# Patient Record
Sex: Male | Born: 1935 | Race: White | Hispanic: No | Marital: Married | Smoking: Never smoker
Health system: Southern US, Community
[De-identification: ages and names within clinical notes are randomized; demographics above are authoritative.]

## PROBLEM LIST (undated history)

## (undated) ENCOUNTER — Emergency Department (HOSPITAL_COMMUNITY): Admission: EM | Payer: Medicare PPO | Source: Home / Self Care

## (undated) DIAGNOSIS — F0390 Unspecified dementia without behavioral disturbance: Secondary | ICD-10-CM

## (undated) DIAGNOSIS — I4891 Unspecified atrial fibrillation: Principal | ICD-10-CM

## (undated) DIAGNOSIS — F0280 Dementia in other diseases classified elsewhere without behavioral disturbance: Secondary | ICD-10-CM

## (undated) DIAGNOSIS — G309 Alzheimer's disease, unspecified: Secondary | ICD-10-CM

## (undated) DIAGNOSIS — I2510 Atherosclerotic heart disease of native coronary artery without angina pectoris: Secondary | ICD-10-CM

---

## 2010-08-29 ENCOUNTER — Emergency Department (HOSPITAL_BASED_OUTPATIENT_CLINIC_OR_DEPARTMENT_OTHER)
Admission: EM | Admit: 2010-08-29 | Discharge: 2010-08-29 | Payer: Self-pay | Source: Home / Self Care | Admitting: Emergency Medicine

## 2010-10-05 ENCOUNTER — Emergency Department (INDEPENDENT_AMBULATORY_CARE_PROVIDER_SITE_OTHER): Payer: Medicare PPO

## 2010-10-05 ENCOUNTER — Emergency Department (HOSPITAL_BASED_OUTPATIENT_CLINIC_OR_DEPARTMENT_OTHER)
Admission: EM | Admit: 2010-10-05 | Discharge: 2010-10-05 | Disposition: A | Discharge status: Home / Self Care | Payer: Medicare PPO | Attending: Emergency Medicine | Admitting: Emergency Medicine

## 2010-10-05 DIAGNOSIS — E041 Nontoxic single thyroid nodule: Secondary | ICD-10-CM | POA: Insufficient documentation

## 2010-10-05 DIAGNOSIS — R0602 Shortness of breath: Secondary | ICD-10-CM | POA: Insufficient documentation

## 2010-10-05 DIAGNOSIS — I10 Essential (primary) hypertension: Secondary | ICD-10-CM | POA: Insufficient documentation

## 2010-10-05 DIAGNOSIS — J438 Other emphysema: Secondary | ICD-10-CM

## 2010-10-05 DIAGNOSIS — Z79899 Other long term (current) drug therapy: Secondary | ICD-10-CM | POA: Insufficient documentation

## 2010-10-05 DIAGNOSIS — I2510 Atherosclerotic heart disease of native coronary artery without angina pectoris: Secondary | ICD-10-CM | POA: Insufficient documentation

## 2010-10-05 DIAGNOSIS — E7800 Pure hypercholesterolemia, unspecified: Secondary | ICD-10-CM | POA: Insufficient documentation

## 2010-10-05 DIAGNOSIS — R0989 Other specified symptoms and signs involving the circulatory and respiratory systems: Secondary | ICD-10-CM | POA: Insufficient documentation

## 2010-10-05 DIAGNOSIS — I498 Other specified cardiac arrhythmias: Secondary | ICD-10-CM | POA: Insufficient documentation

## 2010-10-05 DIAGNOSIS — D721 Eosinophilia, unspecified: Secondary | ICD-10-CM | POA: Insufficient documentation

## 2010-10-05 DIAGNOSIS — R0609 Other forms of dyspnea: Secondary | ICD-10-CM | POA: Insufficient documentation

## 2010-10-05 DIAGNOSIS — I4891 Unspecified atrial fibrillation: Secondary | ICD-10-CM | POA: Insufficient documentation

## 2010-10-05 DIAGNOSIS — J189 Pneumonia, unspecified organism: Secondary | ICD-10-CM | POA: Insufficient documentation

## 2010-10-05 LAB — POCT CARDIAC MARKERS
CKMB, poc: <1 ng/mL — ABNORMAL LOW (ref 1.0–8.0)
Myoglobin, poc: 116 ng/mL (ref 12–200)
Troponin i, poc: <0.05 ng/mL (ref 0.00–0.09)

## 2010-10-05 LAB — CBC
HCT: 35.2 % — ABNORMAL LOW (ref 39.0–52.0)
MCV: 86.5 fL (ref 78.0–100.0)
RBC: 4.07 MIL/uL — ABNORMAL LOW (ref 4.22–5.81)
RDW: 13.6 % (ref 11.5–15.5)
WBC: 6.8 10*3/uL (ref 4.0–10.5)

## 2010-10-05 LAB — DIFFERENTIAL
Basophils Relative: 1 % (ref 0–1)
Eosinophils Relative: 8 % — ABNORMAL HIGH (ref 0–5)
Lymphocytes Relative: 32 % (ref 12–46)
Monocytes Absolute: 0.7 10*3/uL (ref 0.1–1.0)
Monocytes Relative: 10 % (ref 3–12)
Neutrophils Relative %: 49 % (ref 43–77)

## 2010-10-05 LAB — BASIC METABOLIC PANEL
BUN: 20 mg/dL (ref 6–23)
Calcium: 9.7 mg/dL (ref 8.4–10.5)
GFR calc non Af Amer: 48 mL/min — ABNORMAL LOW (ref >60)
Glucose, Bld: 94 mg/dL (ref 70–99)
Sodium: 142 mEq/L (ref 135–145)

## 2010-10-05 LAB — D-DIMER, QUANTITATIVE: D-Dimer, Quant: 2.4 ug/mL-FEU — ABNORMAL HIGH (ref 0.00–0.48)

## 2010-10-05 MED ORDER — IOHEXOL 350 MG/ML SOLN
80.0000 mL | Freq: Once | INTRAVENOUS | Status: AC | PRN
  Administered 2010-10-05: 80 mL via INTRAVENOUS

## 2011-02-05 ENCOUNTER — Emergency Department (HOSPITAL_COMMUNITY): Admission: EM | Admit: 2011-02-05 | Payer: Medicare PPO | Source: Home / Self Care

## 2011-02-05 ENCOUNTER — Emergency Department (HOSPITAL_BASED_OUTPATIENT_CLINIC_OR_DEPARTMENT_OTHER)
Admission: EM | Admit: 2011-02-05 | Discharge: 2011-02-05 | Disposition: A | Discharge status: Home / Self Care | Payer: No Typology Code available for payment source | Attending: Emergency Medicine | Admitting: Emergency Medicine

## 2011-02-05 ENCOUNTER — Emergency Department (INDEPENDENT_AMBULATORY_CARE_PROVIDER_SITE_OTHER): Payer: No Typology Code available for payment source

## 2011-02-05 DIAGNOSIS — E7800 Pure hypercholesterolemia, unspecified: Secondary | ICD-10-CM | POA: Insufficient documentation

## 2011-02-05 DIAGNOSIS — R030 Elevated blood-pressure reading, without diagnosis of hypertension: Secondary | ICD-10-CM

## 2011-02-05 DIAGNOSIS — I10 Essential (primary) hypertension: Secondary | ICD-10-CM | POA: Insufficient documentation

## 2011-02-05 DIAGNOSIS — I2510 Atherosclerotic heart disease of native coronary artery without angina pectoris: Secondary | ICD-10-CM | POA: Insufficient documentation

## 2011-02-05 DIAGNOSIS — I4891 Unspecified atrial fibrillation: Secondary | ICD-10-CM | POA: Insufficient documentation

## 2011-02-05 DIAGNOSIS — R42 Dizziness and giddiness: Secondary | ICD-10-CM

## 2011-02-05 DIAGNOSIS — R059 Cough, unspecified: Secondary | ICD-10-CM

## 2011-02-05 DIAGNOSIS — R220 Localized swelling, mass and lump, head: Secondary | ICD-10-CM | POA: Insufficient documentation

## 2011-02-05 DIAGNOSIS — R221 Localized swelling, mass and lump, neck: Secondary | ICD-10-CM | POA: Insufficient documentation

## 2011-02-05 DIAGNOSIS — Z79899 Other long term (current) drug therapy: Secondary | ICD-10-CM | POA: Insufficient documentation

## 2011-02-05 DIAGNOSIS — M539 Dorsopathy, unspecified: Secondary | ICD-10-CM

## 2011-02-05 DIAGNOSIS — R05 Cough: Secondary | ICD-10-CM

## 2011-02-05 LAB — URINALYSIS, ROUTINE W REFLEX MICROSCOPIC
Glucose, UA: NEGATIVE mg/dL
Hgb urine dipstick: NEGATIVE
Specific Gravity, Urine: 1.011 (ref 1.005–1.030)
Urobilinogen, UA: 0.2 mg/dL (ref 0.0–1.0)
pH: 6.5 (ref 5.0–8.0)

## 2011-02-05 LAB — CBC
MCH: 30.5 pg (ref 26.0–34.0)
MCHC: 35.2 g/dL (ref 30.0–36.0)
MCV: 86.5 fL (ref 78.0–100.0)
Platelets: 245 10*3/uL (ref 150–400)
RDW: 14 % (ref 11.5–15.5)

## 2011-02-05 LAB — DIFFERENTIAL
Basophils Absolute: 0 10*3/uL (ref 0.0–0.1)
Basophils Relative: 0 % (ref 0–1)
Eosinophils Absolute: 0.2 10*3/uL (ref 0.0–0.7)
Monocytes Absolute: 1.3 10*3/uL — ABNORMAL HIGH (ref 0.1–1.0)
Monocytes Relative: 12 % (ref 3–12)
Neutrophils Relative %: 67 % (ref 43–77)

## 2011-02-05 LAB — COMPREHENSIVE METABOLIC PANEL
AST: 22 U/L (ref 0–37)
Albumin: 4.3 g/dL (ref 3.5–5.2)
Calcium: 10.6 mg/dL — ABNORMAL HIGH (ref 8.4–10.5)
Creatinine, Ser: 1.2 mg/dL (ref 0.50–1.35)
GFR calc non Af Amer: 57 mL/min — ABNORMAL LOW (ref >60)
Total Protein: 7.7 g/dL (ref 6.0–8.3)

## 2012-07-14 ENCOUNTER — Encounter (HOSPITAL_COMMUNITY): Payer: Self-pay | Admitting: Emergency Medicine

## 2012-07-14 ENCOUNTER — Inpatient Hospital Stay (HOSPITAL_COMMUNITY)
Admission: EM | Admit: 2012-07-14 | Discharge: 2012-07-18 | DRG: 309 | Disposition: A | Discharge status: Nursing Home (Includes ICF) | Payer: No Typology Code available for payment source | Attending: Internal Medicine | Admitting: Internal Medicine

## 2012-07-14 DIAGNOSIS — E876 Hypokalemia: Secondary | ICD-10-CM | POA: Diagnosis present

## 2012-07-14 DIAGNOSIS — I4891 Unspecified atrial fibrillation: Principal | ICD-10-CM | POA: Diagnosis present

## 2012-07-14 DIAGNOSIS — I2510 Atherosclerotic heart disease of native coronary artery without angina pectoris: Secondary | ICD-10-CM | POA: Diagnosis present

## 2012-07-14 DIAGNOSIS — M479 Spondylosis, unspecified: Secondary | ICD-10-CM | POA: Diagnosis present

## 2012-07-14 DIAGNOSIS — Z7982 Long term (current) use of aspirin: Secondary | ICD-10-CM

## 2012-07-14 DIAGNOSIS — R5381 Other malaise: Secondary | ICD-10-CM | POA: Diagnosis present

## 2012-07-14 DIAGNOSIS — Z7901 Long term (current) use of anticoagulants: Secondary | ICD-10-CM

## 2012-07-14 DIAGNOSIS — M549 Dorsalgia, unspecified: Secondary | ICD-10-CM | POA: Diagnosis present

## 2012-07-14 DIAGNOSIS — Z79899 Other long term (current) drug therapy: Secondary | ICD-10-CM

## 2012-07-14 DIAGNOSIS — G309 Alzheimer's disease, unspecified: Secondary | ICD-10-CM | POA: Diagnosis present

## 2012-07-14 DIAGNOSIS — E2749 Other adrenocortical insufficiency: Secondary | ICD-10-CM | POA: Diagnosis present

## 2012-07-14 DIAGNOSIS — F0280 Dementia in other diseases classified elsewhere without behavioral disturbance: Secondary | ICD-10-CM | POA: Diagnosis present

## 2012-07-14 DIAGNOSIS — I951 Orthostatic hypotension: Secondary | ICD-10-CM | POA: Diagnosis present

## 2012-07-14 DIAGNOSIS — R410 Disorientation, unspecified: Secondary | ICD-10-CM

## 2012-07-14 DIAGNOSIS — F0390 Unspecified dementia without behavioral disturbance: Secondary | ICD-10-CM | POA: Diagnosis present

## 2012-07-14 HISTORY — DX: Atherosclerotic heart disease of native coronary artery without angina pectoris: I25.10

## 2012-07-14 HISTORY — DX: Alzheimer's disease, unspecified: G30.9

## 2012-07-14 HISTORY — DX: Dementia in other diseases classified elsewhere, unspecified severity, without behavioral disturbance, psychotic disturbance, mood disturbance, and anxiety: F02.80

## 2012-07-14 HISTORY — DX: Unspecified dementia, unspecified severity, without behavioral disturbance, psychotic disturbance, mood disturbance, and anxiety: F03.90

## 2012-07-14 HISTORY — DX: Unspecified atrial fibrillation: I48.91

## 2012-07-14 NOTE — ED Notes (Signed)
Per EMS, patient complaining of back pain.  Patient had fall back in November of this year.  Patient was transported tot he hospital, seen, and then released to [facility].  Patient has been shakier on feet since the fall, but can still ambulate.  Today, patient has not been able to get out of bed due to pain.  Alert and oriented x4 (per baseline; patient has dementia).

## 2012-07-15 ENCOUNTER — Emergency Department (HOSPITAL_COMMUNITY): Payer: No Typology Code available for payment source

## 2012-07-15 DIAGNOSIS — F0390 Unspecified dementia without behavioral disturbance: Secondary | ICD-10-CM

## 2012-07-15 DIAGNOSIS — E876 Hypokalemia: Secondary | ICD-10-CM | POA: Diagnosis present

## 2012-07-15 DIAGNOSIS — I4891 Unspecified atrial fibrillation: Principal | ICD-10-CM

## 2012-07-15 DIAGNOSIS — M549 Dorsalgia, unspecified: Secondary | ICD-10-CM

## 2012-07-15 LAB — CBC
Hemoglobin: 12.7 g/dL — ABNORMAL LOW (ref 13.0–17.0)
MCH: 29.5 pg (ref 26.0–34.0)
MCHC: 33.8 g/dL (ref 30.0–36.0)
MCV: 87.2 fL (ref 78.0–100.0)
Platelets: 265 10*3/uL (ref 150–400)
RBC: 4.31 MIL/uL (ref 4.22–5.81)

## 2012-07-15 LAB — GLUCOSE, CAPILLARY

## 2012-07-15 LAB — URINALYSIS, ROUTINE W REFLEX MICROSCOPIC
Bilirubin Urine: NEGATIVE
Nitrite: NEGATIVE
Protein, ur: 100 mg/dL — AB
Specific Gravity, Urine: 1.017 (ref 1.005–1.030)
Urobilinogen, UA: 0.2 mg/dL (ref 0.0–1.0)

## 2012-07-15 LAB — BASIC METABOLIC PANEL
BUN: 32 mg/dL — ABNORMAL HIGH (ref 6–23)
BUN: 25 mg/dL — ABNORMAL HIGH (ref 6–23)
CO2: 25 mEq/L (ref 19–32)
Calcium: 9.3 mg/dL (ref 8.4–10.5)
Calcium: 8.7 mg/dL (ref 8.4–10.5)
GFR calc Af Amer: 56 mL/min — ABNORMAL LOW (ref >90)
GFR calc non Af Amer: 49 mL/min — ABNORMAL LOW (ref >90)
GFR calc non Af Amer: 51 mL/min — ABNORMAL LOW (ref >90)
Glucose, Bld: 117 mg/dL — ABNORMAL HIGH (ref 70–99)
Glucose, Bld: 99 mg/dL (ref 70–99)
Sodium: 137 mEq/L (ref 135–145)

## 2012-07-15 LAB — MRSA PCR SCREENING: MRSA by PCR: NEGATIVE

## 2012-07-15 LAB — CBC WITH DIFFERENTIAL/PLATELET
Basophils Absolute: 0 10*3/uL (ref 0.0–0.1)
Basophils Relative: 0 % (ref 0–1)
Eosinophils Absolute: 0.2 10*3/uL (ref 0.0–0.7)
Hemoglobin: 13.7 g/dL (ref 13.0–17.0)
MCH: 29.4 pg (ref 26.0–34.0)
MCHC: 33.4 g/dL (ref 30.0–36.0)
Monocytes Relative: 12 % (ref 3–12)
Neutro Abs: 6.8 10*3/uL (ref 1.7–7.7)
Neutrophils Relative %: 73 % (ref 43–77)
RDW: 14.1 % (ref 11.5–15.5)

## 2012-07-15 MED ORDER — ONDANSETRON HCL 4 MG/2ML IJ SOLN: 4.0000 mg | Freq: Four times a day (QID) | INTRAMUSCULAR | Status: DC | PRN

## 2012-07-15 MED ORDER — DILTIAZEM HCL 25 MG/5ML IV SOLN
10.0000 mg | Freq: Once | INTRAVENOUS | Status: AC
  Administered 2012-07-15: 25 mg via INTRAVENOUS
  Filled 2012-07-15: qty 5

## 2012-07-15 MED ORDER — ONDANSETRON HCL 4 MG PO TABS: 4.0000 mg | ORAL_TABLET | Freq: Four times a day (QID) | ORAL | Status: DC | PRN

## 2012-07-15 MED ORDER — OXYBUTYNIN CHLORIDE 5 MG PO TABS
5.0000 mg | ORAL_TABLET | Freq: Two times a day (BID) | ORAL | Status: DC
  Administered 2012-07-15 – 2012-07-18 (×7): 5 mg via ORAL
  Filled 2012-07-15 (×12): qty 1

## 2012-07-15 MED ORDER — POTASSIUM CHLORIDE CRYS ER 20 MEQ PO TBCR
40.0000 meq | EXTENDED_RELEASE_TABLET | ORAL | Status: DC
  Administered 2012-07-15 – 2012-07-18 (×8): 40 meq via ORAL
  Filled 2012-07-15 (×12): qty 2

## 2012-07-15 MED ORDER — SODIUM CHLORIDE 0.9 % IV BOLUS (SEPSIS)
250.0000 mL | Freq: Once | INTRAVENOUS | Status: AC
  Administered 2012-07-15: 250 mL via INTRAVENOUS

## 2012-07-15 MED ORDER — SODIUM CHLORIDE 0.9 % IV SOLN: INTRAVENOUS | Status: DC

## 2012-07-15 MED ORDER — ASPIRIN EC 325 MG PO TBEC
325.0000 mg | DELAYED_RELEASE_TABLET | Freq: Every day | ORAL | Status: DC
  Administered 2012-07-15 – 2012-07-18 (×4): 325 mg via ORAL
  Filled 2012-07-15 (×5): qty 1

## 2012-07-15 MED ORDER — ACETAMINOPHEN 500 MG PO TABS
500.0000 mg | ORAL_TABLET | Freq: Every day | ORAL | Status: DC
  Administered 2012-07-15 – 2012-07-18 (×4): 500 mg via ORAL
  Filled 2012-07-15 (×4): qty 1
  Filled 2012-07-15: qty 2
  Filled 2012-07-15: qty 1

## 2012-07-15 MED ORDER — SODIUM CHLORIDE 0.9 % IV SOLN
INTRAVENOUS | Status: AC
  Administered 2012-07-15: 04:00:00 via INTRAVENOUS

## 2012-07-15 MED ORDER — DILTIAZEM HCL 25 MG/5ML IV SOLN
10.0000 mg | Freq: Once | INTRAVENOUS | Status: AC
  Administered 2012-07-15: 10 mg via INTRAVENOUS
  Filled 2012-07-15: qty 5

## 2012-07-15 MED ORDER — HYDROCORTISONE 20 MG PO TABS
20.0000 mg | ORAL_TABLET | Freq: Every day | ORAL | Status: DC
  Administered 2012-07-15 – 2012-07-18 (×4): 20 mg via ORAL
  Filled 2012-07-15 (×5): qty 1

## 2012-07-15 MED ORDER — SOTALOL HCL 80 MG PO TABS
40.0000 mg | ORAL_TABLET | Freq: Every day | ORAL | Status: DC
  Administered 2012-07-15: 40 mg via ORAL
  Administered 2012-07-16: 80 mg via ORAL
  Administered 2012-07-17 – 2012-07-18 (×2): 40 mg via ORAL
  Filled 2012-07-15 (×5): qty 0.5

## 2012-07-15 MED ORDER — MORPHINE SULFATE 2 MG/ML IJ SOLN
1.0000 mg | INTRAMUSCULAR | Status: DC | PRN
  Filled 2012-07-15: qty 1

## 2012-07-15 MED ORDER — VERAPAMIL HCL 120 MG PO TABS
120.0000 mg | ORAL_TABLET | Freq: Every day | ORAL | Status: DC
  Administered 2012-07-15: 120 mg via ORAL
  Filled 2012-07-15 (×2): qty 1

## 2012-07-15 MED ORDER — SODIUM CHLORIDE 0.9 % IV SOLN
INTRAVENOUS | Status: DC
  Administered 2012-07-15: 100 mL/h via INTRAVENOUS

## 2012-07-15 MED ORDER — HYDROCODONE-ACETAMINOPHEN 5-325 MG PO TABS
1.0000 | ORAL_TABLET | ORAL | Status: DC | PRN
  Administered 2012-07-16: 1 via ORAL
  Administered 2012-07-18: 2 via ORAL
  Filled 2012-07-15: qty 1
  Filled 2012-07-15: qty 2

## 2012-07-15 MED ORDER — CALCIUM POLYCARBOPHIL 625 MG PO TABS
1250.0000 mg | ORAL_TABLET | Freq: Two times a day (BID) | ORAL | Status: DC
  Administered 2012-07-15: 1250 mg via ORAL
  Administered 2012-07-16: 11:00:00 via ORAL
  Administered 2012-07-17 (×2): 1250 mg via ORAL
  Administered 2012-07-18: 11:00:00 via ORAL
  Filled 2012-07-15 (×10): qty 2

## 2012-07-15 MED ORDER — DILTIAZEM HCL 100 MG IV SOLR
5.0000 mg/h | Freq: Once | INTRAVENOUS | Status: AC
  Administered 2012-07-15: 5 mg/h via INTRAVENOUS
  Filled 2012-07-15: qty 100

## 2012-07-15 MED ORDER — FLUDROCORTISONE ACETATE 0.1 MG PO TABS
0.0500 mg | ORAL_TABLET | Freq: Every day | ORAL | Status: DC
  Administered 2012-07-15 – 2012-07-17 (×3): 0.05 mg via ORAL
  Administered 2012-07-18: 11:00:00 via ORAL
  Filled 2012-07-15 (×5): qty 0.5

## 2012-07-15 MED ORDER — ENOXAPARIN SODIUM 30 MG/0.3ML ~~LOC~~ SOLN
30.0000 mg | SUBCUTANEOUS | Status: DC
  Administered 2012-07-15 – 2012-07-18 (×4): 30 mg via SUBCUTANEOUS
  Filled 2012-07-15 (×5): qty 0.3

## 2012-07-15 MED ORDER — SIMETHICONE 80 MG PO CHEW
80.0000 mg | CHEWABLE_TABLET | Freq: Four times a day (QID) | ORAL | Status: DC | PRN
  Filled 2012-07-15: qty 1

## 2012-07-15 MED ORDER — GALANTAMINE HYDROBROMIDE ER 24 MG PO CP24
24.0000 mg | ORAL_CAPSULE | Freq: Every day | ORAL | Status: DC
  Administered 2012-07-17 – 2012-07-18 (×2): 24 mg via ORAL
  Filled 2012-07-15 (×6): qty 1

## 2012-07-15 MED ORDER — DILTIAZEM HCL 100 MG IV SOLR
5.0000 mg/h | INTRAVENOUS | Status: DC
  Administered 2012-07-15: 5 mg/h via INTRAVENOUS
  Filled 2012-07-15: qty 100

## 2012-07-15 MED ORDER — MELOXICAM 15 MG PO TABS
15.0000 mg | ORAL_TABLET | Freq: Every day | ORAL | Status: DC
  Administered 2012-07-15: 15 mg via ORAL
  Administered 2012-07-16: 7.5 mg via ORAL
  Administered 2012-07-17 – 2012-07-18 (×2): 15 mg via ORAL
  Filled 2012-07-15 (×5): qty 1

## 2012-07-15 MED ORDER — NITROGLYCERIN 0.4 MG SL SUBL
0.4000 mg | SUBLINGUAL_TABLET | SUBLINGUAL | Status: DC | PRN
Start: 2012-07-15 — End: 2012-07-18

## 2012-07-15 MED ORDER — MEMANTINE HCL 10 MG PO TABS
10.0000 mg | ORAL_TABLET | Freq: Two times a day (BID) | ORAL | Status: DC
  Administered 2012-07-15 – 2012-07-18 (×7): 10 mg via ORAL
  Filled 2012-07-15 (×12): qty 1

## 2012-07-15 NOTE — Evaluation (Signed)
Physical Therapy Evaluation Patient Details Name: Anthony Brewer MRN: [id number] DOB: 1923/05/13 Today's Date: 07/15/2012 Time: 3244-0102 PT Time Calculation (min): 21 min  PT Assessment / Plan / Recommendation Clinical Impression  Patient is an 78 yo male admitted with back pain and Afib.  Patient with decline in functional mobility and cognition.  Recommend patient return to SNF at discharge with continued therapy.  Patient will benefit from acute PT to maximize independence prior to discharge.    PT Assessment  Patient needs continued PT services    Follow Up Recommendations  SNF    Does the patient have the potential to tolerate intense rehabilitation      Barriers to Discharge Decreased caregiver support      Equipment Recommendations  None recommended by PT    Recommendations for Other Services     Frequency Min 3X/week    Precautions / Restrictions Precautions Precautions: Fall Restrictions Weight Bearing Restrictions: No   Pertinent Vitals/Pain       Mobility  Bed Mobility Bed Mobility: Supine to Sit;Sit to Supine Supine to Sit: 4: Min assist;With rails;HOB flat Sit to Supine: 4: Min assist;HOB flat Details for Bed Mobility Assistance: Verbal cues for technique and safety. Transfers Transfers: Sit to Stand;Stand to Sit Sit to Stand: 3: Mod assist;With upper extremity assist;From bed Stand to Sit: 4: Min assist;With upper extremity assist;To bed Details for Transfer Assistance: Verbal cues for hand placement and safety.  Unable to reach fully upright position. Ambulation/Gait Ambulation/Gait Assistance: 1: +2 Total assist (+1 for lines) Ambulation/Gait: Patient Percentage: 70% Ambulation Distance (Feet): 96 Feet Assistive device: Rolling walker Ambulation/Gait Assistance Details: Verbal cues to stand upright, look forward, and keep feet inside RW.  Patient with very flexed trunk during gait.  Unable to stand upright with physical assist/cuing. Gait Pattern:  Step-through pattern;Decreased stride length;Trunk flexed Gait velocity: Slow gait speed           PT Diagnosis: Difficulty walking;Abnormality of gait;Generalized weakness;Altered mental status  PT Problem List: Decreased strength;Decreased activity tolerance;Decreased balance;Decreased mobility;Decreased cognition;Decreased safety awareness;Decreased knowledge of use of DME PT Treatment Interventions: DME instruction;Gait training;Functional mobility training;Cognitive remediation;Patient/family education   PT Goals Acute Rehab PT Goals PT Goal Formulation: With patient Time For Goal Achievement: 07/22/12 Potential to Achieve Goals: Fair Pt will go Supine/Side to Sit: with supervision;with HOB 0 degrees PT Goal: Supine/Side to Sit - Progress: Goal set today Pt will go Sit to Stand: with supervision;with upper extremity assist PT Goal: Sit to Stand - Progress: Goal set today Pt will Ambulate: >150 feet;with supervision;with least restrictive assistive device PT Goal: Ambulate - Progress: Goal set today  Visit Information  Last PT Received On: 07/15/12 Assistance Needed: +2    Subjective Data  Subjective: "I need something to hold onto to walk" Patient Stated Goal: None stated   Prior Functioning  Home Living Available Help at Discharge: Skilled Nursing Facility Additional Comments: Patient in locked unit at [facility]. Prior Function Level of Independence: Needs assistance Needs Assistance: Bathing;Dressing;Meal Prep;Light Housekeeping;Gait Bath: Minimal Dressing: Supervision/set-up Meal Prep: Maximal Light Housekeeping: Total Gait Assistance: Patient was independent with gait without an assistive device. (Wanderer) Able to Take Stairs?: No Driving: No Vocation: Retired Musician: Clinical cytogeneticist  Overall Cognitive Status: History of cognitive impairments - further impaired Area of Impairment: Memory;Awareness of deficits;Problem  solving Arousal/Alertness: Awake/alert Orientation Level: Disoriented to;Place;Time;Situation Behavior During Session: [facility] for tasks performed Memory Deficits: Unable to recall recent events. Safety/Judgement: Impulsive;Decreased awareness of need  for assistance Safety/Judgement - Other Comments: Needed repeated cuing to wait until lines were arranged before getting OOB. Awareness of Deficits: Unable to state why he is in hospital    Extremity/Trunk Assessment Right Upper Extremity Assessment RUE ROM/Strength/Tone: [facility] for tasks assessed Left Upper Extremity Assessment LUE ROM/Strength/Tone: [facility] for tasks assessed Right Lower Extremity Assessment RLE ROM/Strength/Tone: Deficits RLE ROM/Strength/Tone Deficits: Strength 4-/5 Left Lower Extremity Assessment LLE ROM/Strength/Tone: Deficits LLE ROM/Strength/Tone Deficits: Strength 4-/5 Trunk Assessment Trunk Assessment: Kyphotic   Balance    End of Session PT - End of Session Equipment Utilized During Treatment: Gait belt;Oxygen Activity Tolerance: Patient limited by fatigue Patient left: in bed;with call bell/phone within reach Nurse Communication: Mobility status  GP Functional Assessment Tool Used: Clinical judgement Functional Limitation: Mobility: Walking and moving around Mobility: Walking and Moving Around Current Status ([id number]): At least 60 percent but less than 80 percent impaired, limited or restricted Mobility: Walking and Moving Around Goal Status [phone]): At least 1 percent but less than 20 percent impaired, limited or restricted   Vena Austria 07/15/2012, 5:26 PM  Durenda Hurt. Renaldo Fiddler, [facility] Acute Rehab Services Pager [phone]

## 2012-07-15 NOTE — H&P (Signed)
[facility] History and Physical  Anthony Brewer JWJ:[id number] DOB: [birth year] DOA: 07/14/2012  Referring physician: ED physician PCP: No primary provider on file.   Chief Complaint: Back pain  HPI:  Pt is 78 yo male with Alzheimer's dementia, who resides at [facility] and who was brought to [facility] ED for further evaluation of back pain that apparently started one month prior to admission and was at that time at the hospital but released with no specific findings on the cause. Per SNF staff pt was unable to get out of the bed one day prior to admission and was brought for that reason as he is usually at least able to get out of the bed. Pt able to provide some history but not details. He denies chest pain or shortness of breath, no fevers, no chills.  ED EVENTS: found to be in atrial fib with HR in 130's, requiring Cardizem drip and request for SDU admission. XRAY of the back area with DJD.  Assessment and Plan:  Principal Problem:  *Back pain - secondary to DJD and progressive deconditioning - imaging studies indicate no acute pathology - supportive care with analgesia as needed and PT evaluation Active Problems:  Atrial fibrillation - continue Cardizem drip and monitor in SDU - will likely be able to transition to PO in AM - due to high risk of fall pt not Coumadin candidate - continue Sotalol and Aspirin  Hypokalemia - will supplement - BMP in AM  Dementia - stable - continue Namenda   Code Status: Full Family Communication: Pt at bedside Disposition Plan: PT evaluation    Review of Systems:  Constitutional: Negative for fever, chills and malaise/fatigue. Negative for diaphoresis.  HENT: Negative for hearing loss, ear pain, nosebleeds, congestion, sore throat, neck pain, tinnitus and ear discharge.   Eyes: Negative for blurred vision, double vision, photophobia, pain, discharge and redness.  Respiratory: Negative for cough, hemoptysis, sputum production,  shortness of breath, wheezing and stridor.   Cardiovascular: Negative for chest pain, palpitations, orthopnea, claudication and leg swelling.  Gastrointestinal: Negative for nausea, vomiting and abdominal pain. Negative for heartburn, constipation, blood in stool and melena.  Genitourinary: Negative for dysuria, urgency, frequency, hematuria and flank pain.  Musculoskeletal: Negative for myalgias, positive for back pain Skin: Negative for itching and rash.  Neurological: Negative for dizziness, tingling, tremors, sensory change, speech change, focal weakness, loss of consciousness and headaches.  Endo/Heme/Allergies: Negative for environmental allergies and polydipsia. Does not bruise/bleed easily.  Psychiatric/Behavioral: Negative for suicidal ideas. The patient is not nervous/anxious.      Past Medical History  Diagnosis Date  . Dementia   . Alzheimer disease   . Atrial fibrillation   . Coronary artery disease     History reviewed. No pertinent past surgical history.  Social History:  reports that he has never smoked. He does not have any smokeless tobacco history on file. He reports that he does not drink alcohol or use illicit drugs.  No Known Allergies  No family history of cancers  Prior to Admission medications   Medication Sig Start Date End Date Taking? Authorizing Provider  acetaminophen (TYLENOL) 500 MG tablet Take 500 mg by mouth daily.   Yes Historical Provider, MD  aspirin EC 325 MG tablet Take 325 mg by mouth daily.   Yes Historical Provider, MD  fludrocortisone (FLORINEF) 0.1 MG tablet Take 0.05 mg by mouth daily.   Yes Historical Provider, MD  galantamine (RAZADYNE ER) 24 MG 24 hr capsule Take 24  mg by mouth daily.   Yes Historical Provider, MD  hydrocortisone (CORTEF) 20 MG tablet Take 20 mg by mouth daily.   Yes Historical Provider, MD  meloxicam (MOBIC) 15 MG tablet Take 15 mg by mouth daily.   Yes Historical Provider, MD  memantine (NAMENDA) 10 MG tablet Take  10 mg by mouth 2 (two) times daily.   Yes Historical Provider, MD  Multiple Vitamin (MULTIVITAMIN WITH MINERALS) TABS Take 1 tablet by mouth daily.   Yes Historical Provider, MD  nitroGLYCERIN (NITROSTAT) 0.4 MG SL tablet Place 0.4 mg under the tongue every 5 (five) minutes as needed. For chest pain - do not exceed 3 tabs - if no relief call md/911   Yes Historical Provider, MD  oxybutynin (DITROPAN) 5 MG tablet Take 5 mg by mouth 2 (two) times daily.   Yes Historical Provider, MD  simethicone (MYLICON) 125 MG chewable tablet Chew 125 mg by mouth 2 (two) times daily.   Yes Historical Provider, MD  sotalol (BETAPACE) 80 MG tablet Take 40 mg by mouth daily.   Yes Historical Provider, MD  verapamil (CALAN) 120 MG tablet Take 120 mg by mouth daily.   Yes Historical Provider, MD    Physical Exam: Filed Vitals:   07/14/12 2243 07/15/12 0215 07/15/12 0230  BP: 158/99 158/70   Pulse: 127    Temp: 98.7 F (37.1 C)    TempSrc: Oral    Resp: 18 18 20   SpO2: 98%      Physical Exam  Constitutional: Appears well-developed and well-nourished. No distress.  HENT: Normocephalic. External right and left ear normal. Oropharynx is clear and moist.  Eyes: Conjunctivae and EOM are normal. PERRLA, no scleral icterus.  Neck: Normal ROM. Neck supple. No JVD. No tracheal deviation. No thyromegaly.  CVS: IRRR,  no murmurs, no gallops, no carotid bruit.  Pulmonary: Effort and breath sounds normal, decreased breath sounds at bases  Abdominal: Soft. BS +,  no distension, tenderness, rebound or guarding.  Musculoskeletal: Normal range of motion. No edema and no tenderness.  Lymphadenopathy: No lymphadenopathy noted, cervical, inguinal. Neuro: Alert. Normal reflexes, muscle tone coordination. No cranial nerve deficit. Skin: Skin is warm and dry. No rash noted. Not diaphoretic. No erythema. No pallor.  Psychiatric: Normal mood and affect. Behavior, judgment, thought content normal.   Labs on Admission:  Basic  Metabolic Panel:  Lab 07/15/12 6213  NA 137  K 3.1*  CL 99  CO2 26  GLUCOSE 117*  BUN 32*  CREATININE 1.28  CALCIUM 9.3  MG --  PHOS --   Liver Function Tests: No results found for this basename: AST:5,ALT:5,ALKPHOS:5,BILITOT:5,PROT:5,ALBUMIN:5 in the last 168 hours No results found for this basename: LIPASE:5,AMYLASE:5 in the last 168 hours No results found for this basename: AMMONIA:5 in the last 168 hours CBC:  Lab 07/15/12 0036  WBC 9.4  NEUTROABS 6.8  HGB 13.7  HCT 41.0  MCV 88.0  PLT 285   Cardiac Enzymes:  Lab 07/15/12 0036  CKTOTAL --  CKMB --  CKMBINDEX --  TROPONINI <0.30   BNP: No components found with this basename: POCBNP:5 CBG: No results found for this basename: GLUCAP:5 in the last 168 hours  Radiological Exams on Admission: Dg Lumbar Spine Complete  07/15/2012  *RADIOLOGY REPORT*  Clinical Data: Severe back pain.  No known injury.  LUMBAR SPINE - COMPLETE 4+ VIEW  Comparison: None.  Findings: Five non-rib bearing lumbar vertebrae.  Sacroiliac ligament ossification on the right.  Marked degenerative changes throughout the  lumbar spine with disc space narrowing, large spurs and vacuum phenomenon.  Extensive facet degenerative changes at multiple levels.  Mild scoliosis.  No fractures, pars defects or subluxations seen.  Anterior flowing ossification in the upper lumbar and lower thoracic spine.  Atheromatous arterial calcifications without visible aneurysm.  IMPRESSION:  1.  Marked degenerative changes. 2.  Changes of DISH.   Original Report Authenticated By: Beckie Salts, M.D.    Dg Chest Port 1 View  07/15/2012  *RADIOLOGY REPORT*  Clinical Data: Back pain.  PORTABLE CHEST - 1 VIEW  Comparison: 02/05/2011  Findings: Mild hyperinflation of the lungs, stable.  Biapical scarring.  No confluent opacities or effusions.  Heart is normal size.  No acute bony abnormality.  IMPRESSION: COPD/chronic changes.  No acute findings.   Original Report Authenticated By:  Charlett Nose, M.D.     EKG: Normal sinus rhythm, no ST/T wave changes  Debbora Presto, MD  [facility] Pager [phone]  If 7PM-7AM, please contact night-coverage [email or web address] Password [facility] 07/15/2012, 4:28 AM

## 2012-07-15 NOTE — Progress Notes (Signed)
Pt. Had a Potassium level of 2.8, paged Dr. Butler Denmark, gave orders for PO Potassium and started patient on heart-healthy diet.

## 2012-07-15 NOTE — ED Provider Notes (Signed)
History     CSN: [id number]  Arrival date & time 07/14/12  2243   First MD Initiated Contact with Patient 07/14/12 2315      Chief Complaint  Patient presents with  . Back Pain    (Consider location/radiation/quality/duration/timing/severity/associated sxs/prior treatment) The history is provided by the patient, the EMS personnel and the nursing home.   level V caveat applies due to his Alzheimer's disease. Patient is an 78 year old male sent from [facility] nursing home for the complaint of back pain. Patient had a fall back in November of this year according to the nursing home. Patient was transferred to the hospital at that time and then released. Screen. The nursing home stated patient's been shakier on his feet since the fall is still able to ambulate. Today the patient was not able to get out of bed due to pain. Patient's mental status is baseline. Does have a history of Alzheimer's.  Past Medical History  Diagnosis Date  . Dementia   . Alzheimer disease   . Atrial fibrillation   . Coronary artery disease     History reviewed. No pertinent past surgical history.  History reviewed. No pertinent family history.  History  Substance Use Topics  . Smoking status: Never Smoker   . Smokeless tobacco: Not on file  . Alcohol Use: No      Review of Systems  Unable to perform ROS  level V caveat applies to his Alzheimer disease.  Allergies  Review of patient's allergies indicates no known allergies.  Home Medications   Current Outpatient Rx  Name  Route  Sig  Dispense  Refill  . ACETAMINOPHEN 500 MG PO TABS   Oral   Take 500 mg by mouth daily.         . ASPIRIN EC 325 MG PO TBEC   Oral   Take 325 mg by mouth daily.         Marland Kitchen FLUDROCORTISONE ACETATE 0.1 MG PO TABS   Oral   Take 0.05 mg by mouth daily.         Marland Kitchen GALANTAMINE HYDROBROMIDE ER 24 MG PO CP24   Oral   Take 24 mg by mouth daily.         Marland Kitchen HYDROCORTISONE 20 MG PO TABS   Oral   Take 20  mg by mouth daily.         . MELOXICAM 15 MG PO TABS   Oral   Take 15 mg by mouth daily.         Marland Kitchen MEMANTINE HCL 10 MG PO TABS   Oral   Take 10 mg by mouth 2 (two) times daily.         . ADULT MULTIVITAMIN W/MINERALS CH   Oral   Take 1 tablet by mouth daily.         Marland Kitchen NITROGLYCERIN 0.4 MG SL SUBL   Sublingual   Place 0.4 mg under the tongue every 5 (five) minutes as needed. For chest pain - do not exceed 3 tabs - if no relief call md/911         . OXYBUTYNIN CHLORIDE 5 MG PO TABS   Oral   Take 5 mg by mouth 2 (two) times daily.         Marland Kitchen SIMETHICONE 125 MG PO CHEW   Oral   Chew 125 mg by mouth 2 (two) times daily.         Marland Kitchen SOTALOL HCL 80 MG PO TABS  Oral   Take 40 mg by mouth daily.         Marland Kitchen VERAPAMIL HCL 120 MG PO TABS   Oral   Take 120 mg by mouth daily.           BP 158/70  Pulse 127  Temp 98.7 F (37.1 C) (Oral)  Resp 20  SpO2 98%  Physical Exam  Nursing note and vitals reviewed. Constitutional: He appears well-developed and well-nourished. No distress.  HENT:  Head: Normocephalic and atraumatic.  Mouth/Throat: Oropharynx is clear and moist.  Eyes: EOM are normal. Pupils are equal, round, and reactive to light.  Neck: Normal range of motion. Neck supple.  Cardiovascular:  No murmur heard.      Tachycardic with an irregular rhythm  Pulmonary/Chest: Effort normal and breath sounds normal. No respiratory distress. He has no wheezes. He has no rales.  Abdominal: Soft. Bowel sounds are normal. There is no tenderness.  Musculoskeletal: Normal range of motion. He exhibits no edema.  Neurological: He is alert. No cranial nerve deficit. He exhibits normal muscle tone. Coordination normal.  Skin: Skin is warm. No rash noted.    ED Course  Procedures (including critical care time)  Labs Reviewed  CBC WITH DIFFERENTIAL - Abnormal; Notable for the following:    Monocytes Absolute 1.2 (*)     All other components within normal limits   BASIC METABOLIC PANEL - Abnormal; Notable for the following:    Potassium 3.1 (*)     Glucose, Bld 117 (*)     BUN 32 (*)     GFR calc non Af Amer 49 (*)     GFR calc Af Amer 56 (*)     All other components within normal limits  PRO B NATRIURETIC PEPTIDE - Abnormal; Notable for the following:    Pro B Natriuretic peptide (BNP) 1740.0 (*)     All other components within normal limits  TROPONIN I  URINALYSIS, ROUTINE W REFLEX MICROSCOPIC   Dg Lumbar Spine Complete  07/15/2012  *RADIOLOGY REPORT*  Clinical Data: Severe back pain.  No known injury.  LUMBAR SPINE - COMPLETE 4+ VIEW  Comparison: None.  Findings: Five non-rib bearing lumbar vertebrae.  Sacroiliac ligament ossification on the right.  Marked degenerative changes throughout the lumbar spine with disc space narrowing, large spurs and vacuum phenomenon.  Extensive facet degenerative changes at multiple levels.  Mild scoliosis.  No fractures, pars defects or subluxations seen.  Anterior flowing ossification in the upper lumbar and lower thoracic spine.  Atheromatous arterial calcifications without visible aneurysm.  IMPRESSION:  1.  Marked degenerative changes. 2.  Changes of DISH.   Original Report Authenticated By: Beckie Salts, M.D.    Dg Chest Port 1 View  07/15/2012  *RADIOLOGY REPORT*  Clinical Data: Back pain.  PORTABLE CHEST - 1 VIEW  Comparison: 02/05/2011  Findings: Mild hyperinflation of the lungs, stable.  Biapical scarring.  No confluent opacities or effusions.  Heart is normal size.  No acute bony abnormality.  IMPRESSION: COPD/chronic changes.  No acute findings.   Original Report Authenticated By: Charlett Nose, M.D.     Date: 07/15/2012  Rate: 123  Rhythm: atrial fibrillation  QRS Axis: left  Intervals: normal  ST/T Wave abnormalities: ST depressions inferiorly and ST depressions laterally  Conduction Disutrbances:none  Narrative Interpretation:   Old EKG Reviewed: changes noted  Results for orders placed during the  hospital encounter of 07/14/12  CBC WITH DIFFERENTIAL      Component Value Range  WBC 9.4  4.0 - 10.5 K/uL   RBC 4.66  4.22 - 5.81 MIL/uL   Hemoglobin 13.7  13.0 - 17.0 g/dL   HCT 78.2  95.6 - 21.3 %   MCV 88.0  78.0 - 100.0 fL   MCH 29.4  26.0 - 34.0 pg   MCHC 33.4  30.0 - 36.0 g/dL   RDW 08.6  57.8 - 46.9 %   Platelets 285  150 - 400 K/uL   Neutrophils Relative 73  43 - 77 %   Neutro Abs 6.8  1.7 - 7.7 K/uL   Lymphocytes Relative 12  12 - 46 %   Lymphs Abs 1.2  0.7 - 4.0 K/uL   Monocytes Relative 12  3 - 12 %   Monocytes Absolute 1.2 (*) 0.1 - 1.0 K/uL   Eosinophils Relative 2  0 - 5 %   Eosinophils Absolute 0.2  0.0 - 0.7 K/uL   Basophils Relative 0  0 - 1 %   Basophils Absolute 0.0  0.0 - 0.1 K/uL  BASIC METABOLIC PANEL      Component Value Range   Sodium 137  135 - 145 mEq/L   Potassium 3.1 (*) 3.5 - 5.1 mEq/L   Chloride 99  96 - 112 mEq/L   CO2 26  19 - 32 mEq/L   Glucose, Bld 117 (*) 70 - 99 mg/dL   BUN 32 (*) 6 - 23 mg/dL   Creatinine, Ser 6.29  0.50 - 1.35 mg/dL   Calcium 9.3  8.4 - 52.8 mg/dL   GFR calc non Af Amer 49 (*) >90 mL/min   GFR calc Af Amer 56 (*) >90 mL/min  TROPONIN I      Component Value Range   Troponin I <0.30  <0.30 ng/mL  PRO B NATRIURETIC PEPTIDE      Component Value Range   Pro B Natriuretic peptide (BNP) 1740.0 (*) 0 - 450 pg/mL     1. Atrial fibrillation with rapid ventricular response       CRITICAL CARE Performed by: Shelda Jakes.   Total critical care time: 50  Critical care time was exclusive of separately billable procedures and treating other patients.  Critical care was necessary to treat or prevent imminent or life-threatening deterioration.  Critical care was time spent personally by me on the following activities: development of treatment plan with patient and/or surrogate as well as nursing, discussions with consultants, evaluation of patient's response to treatment, examination of patient, obtaining history  from patient or surrogate, ordering and performing treatments and interventions, ordering and review of laboratory studies, ordering and review of radiographic studies, pulse oximetry and re-evaluation of patient's condition.    MDM   The patient was sent from [facility] screen nursing facility with complaint of back pain patient upon arrival here did not the complaint of back pain he does have a history of severe dementia. X-rays of the low back show some degenerative changes but no compression fractures or fractures. However is noted that his heart rate was in the 120s to 130s EKG revealed atrial fibrillation which she has a history however he has been persistently with a heart rate above 100 patient received diltiazem a.m. 10 mg IV push with only temporary improvement in heart rate but never got below 100 received another 10 mg of diltiazem and push heart rate still remained above 100 so IV drip of diltiazem and started to be titrated between 5 and 15 mg. Contacted the undersigned admitting team which will  be the [facility] for this patient and he will be admitted to step down. Patient's chest x-ray without significant findings EKG as stated atrial fib with rapid ventricular rate BNP was elevated suggestive patient is at risk for developing congestive heart failure troponin was negative. No significant electrolyte abnormalities other than some mild hypo-bulimia with a potassium of 3.1 renal function was good with a creatinine of 1.28 no leukocytosis no significant anemia.       Shelda Jakes, MD 07/15/12 [id number]

## 2012-07-15 NOTE — Progress Notes (Signed)
[facility]  Pt examined and chart reviewed. Spoke with patient's son at bedside. Rate now controlled on oral medication- these are not new medications.   Pt's back pain discussed with son who thinks it may be "phantom" pain as it comes and goes very quickly- suspect pts dementia is playing a large part in his c/o pain. XRAY reveals DJD. Pt always walks "hunched over"  Stable to d/c to SNF tomorrow- family would like PT to be done there -  - Hypokalemia noted and replaced- f/u in AM    Calvert Cantor, MD [phone]

## 2012-07-15 NOTE — Progress Notes (Signed)
Pt BP was 88/57 with a HR of 62.  MD paged, orders given to d/c Cardizem drip.  Patient is a asymptomatic.  Will continue to assess the situation.

## 2012-07-16 DIAGNOSIS — E876 Hypokalemia: Secondary | ICD-10-CM

## 2012-07-16 DIAGNOSIS — I2510 Atherosclerotic heart disease of native coronary artery without angina pectoris: Secondary | ICD-10-CM | POA: Diagnosis present

## 2012-07-16 DIAGNOSIS — R404 Transient alteration of awareness: Secondary | ICD-10-CM

## 2012-07-16 DIAGNOSIS — R410 Disorientation, unspecified: Secondary | ICD-10-CM | POA: Diagnosis present

## 2012-07-16 LAB — BASIC METABOLIC PANEL
CO2: 25 mEq/L (ref 19–32)
Chloride: 102 mEq/L (ref 96–112)
Glucose, Bld: 100 mg/dL — ABNORMAL HIGH (ref 70–99)
Sodium: 136 mEq/L (ref 135–145)

## 2012-07-16 LAB — CLOSTRIDIUM DIFFICILE BY PCR: Toxigenic C. Difficile by PCR: NEGATIVE

## 2012-07-16 MED ORDER — HALOPERIDOL LACTATE 5 MG/ML IJ SOLN: 5.0000 mg | Freq: Once | INTRAMUSCULAR | Status: DC

## 2012-07-16 MED ORDER — VERAPAMIL HCL 80 MG PO TABS
80.0000 mg | ORAL_TABLET | Freq: Three times a day (TID) | ORAL | Status: DC
  Administered 2012-07-16 – 2012-07-18 (×5): 80 mg via ORAL
  Filled 2012-07-16 (×12): qty 1

## 2012-07-16 MED ORDER — DILTIAZEM HCL 100 MG IV SOLR
5.0000 mg/h | INTRAVENOUS | Status: DC
  Administered 2012-07-16: 15 mg/h via INTRAVENOUS
  Filled 2012-07-16: qty 100

## 2012-07-16 MED ORDER — DILTIAZEM LOAD VIA INFUSION
10.0000 mg | Freq: Once | INTRAVENOUS | Status: AC
  Filled 2012-07-16 (×2): qty 10

## 2012-07-16 NOTE — Progress Notes (Signed)
Pt still agitated and trying to hit the nurses refusing care and pulling of restraints, family made aware and  We requested safety sitter

## 2012-07-16 NOTE — Care Management Note (Addendum)
    Page 1 of 2   07/18/2012     2:37:28 PM   CARE MANAGEMENT NOTE 07/18/2012  Patient:  Anthony Brewer,Anthony Brewer   Account Number:  [id number]  Date Initiated:  07/16/2012  Documentation initiated by:  Junius Creamer  Subjective/Objective Assessment:   adm w at fib     Action/Plan:   from nsg facility, sw ref made   Anticipated DC Date:  07/18/2012   Anticipated DC Plan:  ASSISTED LIVING / REST HOME  In-house referral  Clinical Social Worker      DC Associate Professor  CM consult      [facility]  HOME HEALTH  Resumption Of Svcs/PTA Provider   Choice offered to / List presented to:          HH arranged  HH-2 PT  HH-3 OT      [facility] agency  [facility]   Status of service:  Completed, signed off Medicare Important Message given?   (If response is "NO", the following Medicare IM given date fields will be blank) Date Medicare IM given:   Date Additional Medicare IM given:    Discharge Disposition:  HOME W HOME HEALTH SERVICES  Per UR Regulation:  Reviewed for med. necessity/level of care/duration of stay  If discussed at Long Length of Stay Meetings, dates discussed:    Comments:  07/18/12 Donnalynn Wheeless,RN,BSN [phone] PT FOR DC BACK TO ASSISTED LIVING FACILITY TODAY.   PTA, PT ACTIVE WITH [facility] FOR PT/OT .Marland KitchenMarland KitchenPLAN TO RESUME HH SERVICES WITH HH AGENCY AT DC.  [facility] AWARE OF DC TO ALF TODAY.  12/9 10:25a debbie dowell rn,bsn [phone]

## 2012-07-16 NOTE — Progress Notes (Signed)
[facility] Progress Note [place] TEAM 1 - Stepdown/ICU TEAM   Anthony Brewer [id number]:[id number] DOB: 11-19-1922 DOA: 07/14/2012 PCP: No primary provider on file.  Brief narrative: 78 year old male patient with underlying dementia who resides in a skilled nursing facility. Sent to the emergency department because of progressive back pain for one month. According to the nursing facility staff the patient was unable to get out of bed 24 hours prior to admission which is not usual for him. While in the emergency department the patient was found to be in atrial fibrillation with RVR with heart rates in the 130s and a Cardizem infusion was initiated. An x-ray of the back was completed that only demonstrated DJD. He was subsequently admitted to the step down unit for further monitoring and treatment  Assessment/Plan:  Atrial fibrillation with RVR *suspect pain and acute delirium feeding patient's tachycardia *d/c cardizem IV today  *Continue home dose Betapace *Unclear if patient was on long acting or short acting Verapamil at home therefore have changed the daily 120 mg verapamil to 80 mg 3 times a day short-acting *Given patient's mental status, advanced age and increased risk for falls do not feel is an appropriate Coumadin candidate - CHAD-VASc score is 3  Dementia with Acute delirium *Continue home dementia medication *Try to stabilize environment to minimize changes and try to utilize nonpharmacological means of control *one-time dose of Haldol IV - QTC less than 500 ms *Control other exacerbating factors such as back pain  Back pain *Plain films revealed no evidence of severe acute injury   Hypokalemia *Replete and follow electrolytes - goal K+ is 4.0 *Check a magnesium level  ?? Chronic adrenal insufficiency / orthostatic hypotension *Was on Florinef and Solu-Cortef tablet daily prior to admission so we'll continue - no indication for stress dose steroids at this time  CAD  (coronary artery disease) *Asymptomatic and no evidence of ischemic changes on telemetry or EKG  DVT prophylaxis: Lovenox Code Status: Full Family Communication: No family at bedside at time of our interaction with patient Disposition Plan: Remain in step down until off cardizem gtt w/ stable rate  Consultants: None  Procedures: None  Antibiotics: None  HPI/Subjective: Patient is alert but easily agitated and very confused. Easily removes restraint devices and has had difficulty remaining calm with assistance of a sitter. No apparent complaints verbalized.   Objective: Blood pressure 127/84, pulse 115, temperature 97.5 F (36.4 C), temperature source Oral, resp. rate 21, weight 78.4 kg (172 lb 13.5 oz), SpO2 96.00%.  Intake/Output Summary (Last 24 hours) at 07/16/12 1221 Last data filed at 07/16/12 0700  Gross per 24 hour  Intake   1195 ml  Output   2200 ml  Net  -1005 ml     Exam: General: No acute respiratory distress but is quite agitated and restless Lungs: Clear to auscultation bilaterally without wheezes or crackles, room air Cardiovascular: Irregular rate and rhythm without murmur gallop or rub normal S1 and S2, atrial fibrillation with ventricular response is greater than 110 consistently, no peripheral edema, no JVD Abdomen: Nontender, nondistended, soft, bowel sounds positive, no rebound, no ascites, no appreciable mass Musculoskeletal: No significant cyanosis, clubbing of bilateral lower extremities Neurological: Patient is alert and oriented to name only, moves all extremities x4 without any appreciable focal neurological deficits  Data Reviewed: Basic Metabolic Panel:  Lab 07/16/12 5366 07/15/12 0635 07/15/12 0036  NA 136 137 137  K 3.5 2.8* 3.1*  CL 102 100 99  CO2 25 25 26  GLUCOSE 100* 99 117*  BUN 21 25* 32*  CREATININE 1.12 1.23 1.28  CALCIUM 8.7 8.7 9.3  MG -- -- --  PHOS -- -- --   CBC:  Lab 07/15/12 0635 07/15/12 0036  WBC 6.8 9.4   NEUTROABS -- 6.8  HGB 12.7* 13.7  HCT 37.6* 41.0  MCV 87.2 88.0  PLT 265 285   Cardiac Enzymes:  Lab 07/15/12 0036  CKTOTAL --  CKMB --  CKMBINDEX --  TROPONINI <0.30   BNP (last 3 results)  Basename 07/15/12 0036  PROBNP 1740.0*   CBG:  Lab 07/15/12 0755  GLUCAP 100*    Recent Results (from the past 240 hour(s))  MRSA PCR SCREENING     Status: Normal   Collection Time   07/15/12  5:39 AM      Component Value Range Status Comment   MRSA by PCR NEGATIVE  NEGATIVE Final   CLOSTRIDIUM DIFFICILE BY PCR     Status: Normal   Collection Time   07/15/12  9:29 PM      Component Value Range Status Comment   C difficile by pcr NEGATIVE  NEGATIVE Final      Studies:  Recent x-ray studies have been reviewed in detail by the Attending Physician  Scheduled Meds:  Reviewed in detail by the Attending Physician   Junious Silk, ANP [facility] Office  [phone] Pager [phone]  On-Call/Text Page:      [email or web address]      password [email or web address]  If 7PM-7AM, please contact night-coverage [email or web address] Password [email or web address] 07/16/2012, 12:21 PM   LOS: 2 days   I have personally examined this patient and reviewed the entire database. I have reviewed the above note, made any necessary editorial changes, and agree with its content.  Lonia Blood, MD [facility]

## 2012-07-16 NOTE — Progress Notes (Signed)
Pt has safety sitter for now  restraints off

## 2012-07-16 NOTE — Progress Notes (Signed)
Occupational Therapy Note:  Chart reviewed.  Pt. Has h/o "severe" dementia, and resided at SNF PTA with plans to return to SNF.  Pt. Is being followed by PT for mobility.  Will defer OT back to SNF as pt. Will peform best in familiar environment.  Will discharge OT at this time.  Please re-order if disposition changes.  Thank you.  Jeani Hawking, OTR/L [phone]

## 2012-07-16 NOTE — Clinical Social Work Psychosocial (Signed)
     Clinical Social Work Department BRIEF PSYCHOSOCIAL ASSESSMENT 07/16/2012  Patient:  Anthony Brewer,Anthony Brewer     Account Number:  [id number]     Admit date:  07/14/2012  Clinical Social Worker:  Hulan Fray  Date/Time:  07/16/2012 03:14 PM  Referred by:  Physician  Date Referred:  07/16/2012 Referred for  Other - See comment   Other Referral:   Admit from facility, NHP   Interview type:  Other - See comment Other interview type:   chart review  Anthony Brewer- Admissions from [facility]    PSYCHOSOCIAL DATA Living Status:  FACILITY Admitted from facility:  [facility] Level of care:  Assisted Living Primary support name:  Anthony Brewer Primary support relationship to patient:  CHILD, ADULT Degree of support available:   supportive    CURRENT CONCERNS Current Concerns  Post-Acute Placement   Other Concerns:    SOCIAL WORK ASSESSMENT / PLAN Clinical Social Worker spoke with Anthony Brewer from [facility] and confirmed patient is from their facility in the memory care unit. Anthony Brewer reported that she will need to come evaluate patient before he can be admitted back to the facility. CSW will complete FL2 for MD's signature and update Anthony Brewer when patient is getting close for discharge so that she can evaluate. CSW called patient's son Anthony Brewer and left a message to return call. CSW will continue to follow.   Assessment/plan status:  Psychosocial Support/Ongoing Assessment of Needs Other assessment/ plan:   Information/referral to community resources:   Patient is from facility    PATIENTS/FAMILYS RESPONSE TO PLAN OF CARE: Patient is disoriented and CSW could not reach patient's son, but CSW did speak with Anthony Brewer at [facility] and they will need to evaluate patient before he returns.

## 2012-07-16 NOTE — Progress Notes (Signed)
Heart rated in 60-70 still afib,Cardizem stopped as  Ordered  Will continue to monitor

## 2012-07-17 ENCOUNTER — Other Ambulatory Visit: Payer: Self-pay

## 2012-07-17 DIAGNOSIS — I2510 Atherosclerotic heart disease of native coronary artery without angina pectoris: Secondary | ICD-10-CM

## 2012-07-17 LAB — BASIC METABOLIC PANEL
CO2: 23 mEq/L (ref 19–32)
Chloride: 102 mEq/L (ref 96–112)
Glucose, Bld: 84 mg/dL (ref 70–99)
Potassium: 3.8 mEq/L (ref 3.5–5.1)
Sodium: 136 mEq/L (ref 135–145)

## 2012-07-17 LAB — MAGNESIUM: Magnesium: 1.7 mg/dL (ref 1.5–2.5)

## 2012-07-17 LAB — GLUCOSE, CAPILLARY: Glucose-Capillary: 90 mg/dL (ref 70–99)

## 2012-07-17 MED ORDER — VERAPAMIL HCL 80 MG PO TABS: 80.0000 mg | ORAL_TABLET | Freq: Three times a day (TID) | ORAL | Status: DC

## 2012-07-17 MED ORDER — WHITE PETROLATUM GEL
Status: AC
  Administered 2012-07-17: 06:00:00
  Filled 2012-07-17: qty 5

## 2012-07-17 NOTE — Progress Notes (Signed)
Transferred to 2025 by wheelchair, report given to RN, belongings with  Pt.

## 2012-07-17 NOTE — Discharge Summary (Addendum)
Physician Discharge Summary  Anthony Brewer ZOX:[id number] DOB: September 05, 1922 DOA: 07/14/2012  PCP: No primary provider on file.  Admit date: 07/14/2012 Discharge date: 07/18/2012  Time spent: 45 minutes  Discharge Diagnoses:  Principal Problem:  *Atrial fibrillation with RVR Active Problems:  Dementia  Back pain  Hypokalemia  Acute delirium  CAD (coronary artery disease)   Discharge Condition: stable   Diet recommendation: heart healthy  Filed Weights   07/15/12 0500 07/16/12 0500  Weight: 83.7 kg (184 lb 8.4 oz) 78.4 kg (172 lb 13.5 oz)    History of present illness:  Pt is 78 yo male with Alzheimer's dementia, who resides at [facility] and who was brought to [facility] ED for further evaluation of back pain that apparently started one month prior to admission and was at that time at the hospital but released with no specific findings on the cause. Per SNF staff pt was unable to get out of the bed one day prior to admission and was brought for that reason as he is usually at least able to get out of the bed. Pt able to provide some history but not details. He denies chest pain or shortness of breath, no fevers, no chills.   Hospital Course:   A-fib with RVR He was initially placed on a Cardizem infusion and then transitioned to his usual outpt meds- Betapace 40 mg and Verapamil 120 mg (short acting??- not mentioned on MAR from facility). HR became uncontrolled and therefore we have increased Verapamil to 80 mg TID (this is the short acting). HR is well controlled now and if it continues to be so, he will d/c back to his ALF- [facility] in the AM. I have contacted his daughter in law Keenan Trefry and updated her of plans.   Back Pain Usually comes and goes quickly - family is aware that he has DJD and this is what xrays revealed during this hospital stay- he son supects much of his complaint of back pain stems from confusion/dementia and is really unreliable. It has happened in the  past that he patient states he has back pain and is unable to be gotten out of bed but eventually get out with significant persuasion. Pt normally walks "hunched over" per son.  Acute Delirium on Dementia hopefully should resolve back at facility we currently have a sitter for him   Discharge Exam: Filed Vitals:   07/17/12 1240 07/17/12 1619 07/17/12 2016 07/18/12 0513  BP: 138/71 159/77 138/69 135/81  Pulse: 89 76 56 53  Temp: 98.4 F (36.9 C) 98.4 F (36.9 C) 98.1 F (36.7 C) 97.3 F (36.3 C)  TempSrc: Oral Oral Axillary Oral  Resp:  17 18 18   Height:      Weight:      SpO2: 97% 97% 98% 96%    General: alert but quite confused- mild confusion is his baseline but this is more than his usual Cardiovascular: IIRR, controlled in 70-80 range, no murmurs Respiratory: CTA b/l   Discharge Instructions      Discharge Orders    Future Orders Please Complete By Expires   Diet - low sodium heart healthy      Increase activity slowly      Call MD for:  severe uncontrolled pain          Medication List     As of 07/18/2012 11:42 AM    TAKE these medications         acetaminophen 500 MG tablet  Commonly known as: TYLENOL   Take 500 mg by mouth daily.      aspirin EC 325 MG tablet   Take 325 mg by mouth daily.      fludrocortisone 0.1 MG tablet   Commonly known as: FLORINEF   Take 0.05 mg by mouth daily.      galantamine 24 MG 24 hr capsule   Commonly known as: RAZADYNE ER   Take 24 mg by mouth daily.      hydrocortisone 20 MG tablet   Commonly known as: CORTEF   Take 20 mg by mouth daily.      meloxicam 15 MG tablet   Commonly known as: MOBIC   Take 15 mg by mouth daily.      memantine 10 MG tablet   Commonly known as: NAMENDA   Take 10 mg by mouth 2 (two) times daily.      multivitamin with minerals Tabs   Take 1 tablet by mouth daily.      nitroGLYCERIN 0.4 MG SL tablet   Commonly known as: NITROSTAT   Place 0.4 mg under the tongue every 5 (five)  minutes as needed. For chest pain - do not exceed 3 tabs - if no relief call md/911      oxybutynin 5 MG tablet   Commonly known as: DITROPAN   Take 5 mg by mouth 2 (two) times daily.      simethicone 125 MG chewable tablet   Commonly known as: MYLICON   Chew 125 mg by mouth 2 (two) times daily.      sotalol 80 MG tablet   Commonly known as: BETAPACE   Take 40 mg by mouth daily.      verapamil 80 MG tablet   Commonly known as: CALAN   Take 1 tablet (80 mg total) by mouth every 8 (eight) hours.            The results of significant diagnostics from this hospitalization (including imaging, microbiology, ancillary and laboratory) are listed below for reference.    Significant Diagnostic Studies: Dg Lumbar Spine Complete  07/15/2012  *RADIOLOGY REPORT*  Clinical Data: Severe back pain.  No known injury.  LUMBAR SPINE - COMPLETE 4+ VIEW  Comparison: None.  Findings: Five non-rib bearing lumbar vertebrae.  Sacroiliac ligament ossification on the right.  Marked degenerative changes throughout the lumbar spine with disc space narrowing, large spurs and vacuum phenomenon.  Extensive facet degenerative changes at multiple levels.  Mild scoliosis.  No fractures, pars defects or subluxations seen.  Anterior flowing ossification in the upper lumbar and lower thoracic spine.  Atheromatous arterial calcifications without visible aneurysm.  IMPRESSION:  1.  Marked degenerative changes. 2.  Changes of DISH.   Original Report Authenticated By: Beckie Salts, M.D.    Dg Chest Port 1 View  07/15/2012  *RADIOLOGY REPORT*  Clinical Data: Back pain.  PORTABLE CHEST - 1 VIEW  Comparison: 02/05/2011  Findings: Mild hyperinflation of the lungs, stable.  Biapical scarring.  No confluent opacities or effusions.  Heart is normal size.  No acute bony abnormality.  IMPRESSION: COPD/chronic changes.  No acute findings.   Original Report Authenticated By: Charlett Nose, M.D.     Microbiology: Recent Results (from the  past 240 hour(s))  MRSA PCR SCREENING     Status: Normal   Collection Time   07/15/12  5:39 AM      Component Value Range Status Comment   MRSA by PCR NEGATIVE  NEGATIVE Final  CLOSTRIDIUM DIFFICILE BY PCR     Status: Normal   Collection Time   07/15/12  9:29 PM      Component Value Range Status Comment   C difficile by pcr NEGATIVE  NEGATIVE Final      Labs: Basic Metabolic Panel:  Lab 07/17/12 1610 07/16/12 0440 07/15/12 0635 07/15/12 0036  NA 136 136 137 137  K 3.8 3.5 2.8* 3.1*  CL 102 102 100 99  CO2 23 25 25 26   GLUCOSE 84 100* 99 117*  BUN 22 21 25* 32*  CREATININE 1.15 1.12 1.23 1.28  CALCIUM 9.0 8.7 8.7 9.3  MG 1.7 -- -- --  PHOS -- -- -- --   Liver Function Tests: No results found for this basename: AST:5,ALT:5,ALKPHOS:5,BILITOT:5,PROT:5,ALBUMIN:5 in the last 168 hours No results found for this basename: LIPASE:5,AMYLASE:5 in the last 168 hours No results found for this basename: AMMONIA:5 in the last 168 hours CBC:  Lab 07/15/12 0635 07/15/12 0036  WBC 6.8 9.4  NEUTROABS -- 6.8  HGB 12.7* 13.7  HCT 37.6* 41.0  MCV 87.2 88.0  PLT 265 285   Cardiac Enzymes:  Lab 07/15/12 0036  CKTOTAL --  CKMB --  CKMBINDEX --  TROPONINI <0.30   BNP: BNP (last 3 results)  Basename 07/15/12 0036  PROBNP 1740.0*   CBG:  Lab 07/18/12 0607 07/17/12 0817 07/15/12 0755  GLUCAP 94 90 100*       Signed:  HONGALGI,ANAND  [facility] 07/18/2012, 11:42 AM

## 2012-07-17 NOTE — Progress Notes (Signed)
Physical Therapy Treatment Patient Details Name: Jia Mohamed MRN: [id number] DOB: 12/17/1922 Today's Date: 07/17/2012 Time: 7829-5621 PT Time Calculation (min): 30 min  PT Assessment / Plan / Recommendation Comments on Treatment Session  Pt. admitted from ALF memory care with back pain, Afib with RVR, and history of dementia; Pt. progressing with mobility but unsafe; requires cueing throughout mobility for safety. Pt. complains of back pain when standing and with ambulation. Pt. able to follow commands with minimal to no cueing to get pt. to attend to task. Pt. very plesant throughout tx. and willing to participate in all mobility.    Follow Up Recommendations  ALF as long as they can provide 24hr supervision and assist with mobility           Equipment Recommendations  None recommended by PT       Frequency Min 3X/week   Plan Discharge plan remains appropriate;Frequency remains appropriate    Precautions / Restrictions Precautions Precautions: Fall Restrictions Weight Bearing Restrictions: No   Pertinent Vitals/Pain HR 85 at start of tx; 86 after ambulation; 91 after exercises with pt. sitting in chair. BP 132/79 O2 99% on room air at end of tx.; dropped to 88% at lowest with exercise and pt. cued on deep breathing    Mobility  Bed Mobility Bed Mobility: Sitting - Scoot to Edge of Bed Supine to Sit: 4: Min guard;HOB elevated (15 degrees) Sitting - Scoot to Edge of Bed: 4: Min guard Details for Bed Mobility Assistance: Pt. required cueing for sequencing. Transfers Sit to Stand: 4: Min assist;From bed Stand to Sit: 4: Min assist;To chair/3-in-1;With armrests Details for Transfer Assistance: Pt. required cueing for sequencing. Pt. unsteady with sit -> stand and unable to stand upright; maintains flexed posture. Pt. able to reach back for armrest to assist with controling descent. Ambulation/Gait Ambulation/Gait Assistance: 4: Min assist Ambulation Distance (Feet): 150  Feet Assistive device: Rolling walker Ambulation/Gait Assistance Details: Pt. required cueing to stay inside RW; pt. stated that standing closer to RW hurt his back. Pt. with flexed posture throughout gait. Gait Pattern: Step-through pattern;Decreased stride length;Trunk flexed Gait velocity: decreased Stairs: No    Exercises General Exercises - Lower Extremity Ankle Circles/Pumps: AROM;Both;20 reps;Seated Short Arc Quad: AROM;Both;20 reps;Seated Hip ABduction/ADduction: AROM;Both;20 reps;Seated Hip Flexion/Marching: AROM;Both;20 reps;Seated     PT Goals Acute Rehab PT Goals PT Goal: Supine/Side to Sit - Progress: Progressing toward goal PT Goal: Sit to Stand - Progress: Progressing toward goal PT Goal: Ambulate - Progress: Progressing toward goal  Visit Information  Last PT Received On: 07/17/12 Assistance Needed: +2 (for lines)    Subjective Data  Subjective: "I used to play basketball."   Cognition  Overall Cognitive Status: Impaired Area of Impairment: Memory Arousal/Alertness: Awake/alert Orientation Level: Disoriented to;Time;Place Behavior During Session: WFL for tasks performed       End of Session PT - End of Session Equipment Utilized During Treatment: Gait belt Activity Tolerance: Patient tolerated treatment well Patient left: in chair;with call bell/phone within reach;with nursing in room (Pt. has sitter) Nurse Communication: Mobility status     Army Chaco SPT 07/17/2012, 9:01 AM Delaney Meigs, PT [phone]

## 2012-07-17 NOTE — Clinical Social Work Note (Signed)
Clinical Social Worker spoke with Laureen, admissions at [facility] and she will come today to evaluate patient for return. Laureen requested MAR to be faxed to [phone] and CSW faxed requested documents. CSW will continue to follow.   Rozetta Nunnery MSW, [employer] [phone]

## 2012-07-18 MED ORDER — VERAPAMIL HCL 80 MG PO TABS: 80.0000 mg | ORAL_TABLET | Freq: Three times a day (TID) | ORAL | Status: AC

## 2012-07-18 NOTE — Clinical Social Work Note (Signed)
CSW received confirmation that memory care ALF is ready for pt to return. CSW updated family and they will meet pt at ALF at 5pm today.  CSW will arrange non-emergency ambulance for dc.   Frederico Hamman, LCSW [phone]

## 2012-07-18 NOTE — Progress Notes (Signed)
Discharged to snf with office visits in place

## 2012-07-18 NOTE — Progress Notes (Signed)
[facility] Progress Note   Anthony Brewer WUJ:[id number] DOB: 1923/04/01 DOA: 07/14/2012 PCP: No primary provider on file.  Brief narrative: 78 year old male patient with underlying dementia who resides in a skilled nursing facility. Sent to the emergency department because of progressive back pain for one month. According to the nursing facility staff the patient was unable to get out of bed 24 hours prior to admission which is not usual for him. While in the emergency department the patient was found to be in atrial fibrillation with RVR with heart rates in the 130s and a Cardizem infusion was initiated. An x-ray of the back was completed that only demonstrated DJD. He was subsequently admitted to the step down unit for further monitoring and treatment  Assessment/Plan:  Atrial fibrillation with RVR Suspect pain and acute delirium was feeding patient's tachycardia.He was initially placed on a Cardizem infusion and then transitioned to his usual outpt meds- Betapace 40 mg and Verapamil 120 mg (short acting??- not mentioned on MAR from facility). HR was not adequately controlled and therefore we have increased Verapamil to 80 mg TID (this is the short acting). HR is well controlled now/ in SR with PAC's. Given patient's mental status, advanced age and increased risk for falls do not feel is an appropriate Coumadin candidate - CHAD-VASc score is 3. He will d/c back to his ALF- [facility] today. Dr. Butler Denmark contacted his daughter in law Christiaan Strebeck on 12/10 and updated her of plans.   Back Pain  Usually comes and goes quickly - family is aware that he has DJD and this is what xrays revealed during this hospital stay- he son supects much of his complaint of back pain stems from confusion/dementia and is really unreliable. It has happened in the past that he patient states he has back pain and is unable to be gotten out of bed but eventually get out with significant persuasion. Pt normally walks  "hunched over" per son.  Dementia with Acute delirium Currently pleasantly without agitation. Probably his baseline.  Hypokalemia Repleted.  ?? Chronic adrenal insufficiency / orthostatic hypotension Was on Florinef and Solu-Cortef tablet daily prior to admission so we'll continue - no indication for stress dose steroids at this time  CAD (coronary artery disease) Asymptomatic and no evidence of ischemic changes on telemetry or EKG  DVT prophylaxis: Lovenox Code Status: Full Family Communication: No family at bedside at time of our interaction with patient. Dr. Butler Denmark d/w family on 12/10. Disposition Plan: D/C to ALF - [facility] today.  Consultants: None  Procedures: None  Antibiotics: None  HPI/Subjective: Pleasantly confused. Denies pain or dyspnea or any other complaints.   Objective: Blood pressure 135/81, pulse 53, temperature 97.3 F (36.3 C), temperature source Oral, resp. rate 18, height 6' (1.829 m), weight 78.4 kg (172 lb 13.5 oz), SpO2 96.00%.  Intake/Output Summary (Last 24 hours) at 07/18/12 1125 Last data filed at 07/18/12 0819  Gross per 24 hour  Intake   1020 ml  Output    850 ml  Net    170 ml     Exam: General: Appears comfortable. Lungs: Clear to auscultation bilaterally. No increased work of breathing. Cardiovascular: Regular rate and rhythm without murmur gallop or rub normal S1 and S2, . Tele: SR with PAC's. No peripheral edema, no JVD Abdomen: Nontender, nondistended, soft, bowel sounds positive, no rebound, no ascites, no appreciable mass Musculoskeletal: No cyanosis, clubbing of bilateral lower extremities Neurological: Patient is alert and oriented to name only, moves all  extremities x4 without any appreciable focal neurological deficits  Data Reviewed: Basic Metabolic Panel:  Lab 07/17/12 8119 07/16/12 0440 07/15/12 0635 07/15/12 0036  NA 136 136 137 137  K 3.8 3.5 2.8* 3.1*  CL 102 102 100 99  CO2 23 25 25 26   GLUCOSE 84  100* 99 117*  BUN 22 21 25* 32*  CREATININE 1.15 1.12 1.23 1.28  CALCIUM 9.0 8.7 8.7 9.3  MG 1.7 -- -- --  PHOS -- -- -- --   CBC:  Lab 07/15/12 0635 07/15/12 0036  WBC 6.8 9.4  NEUTROABS -- 6.8  HGB 12.7* 13.7  HCT 37.6* 41.0  MCV 87.2 88.0  PLT 265 285   Cardiac Enzymes:  Lab 07/15/12 0036  CKTOTAL --  CKMB --  CKMBINDEX --  TROPONINI <0.30   BNP (last 3 results)  Basename 07/15/12 0036  PROBNP 1740.0*   CBG:  Lab 07/18/12 0607 07/17/12 0817 07/15/12 0755  GLUCAP 94 90 100*    Recent Results (from the past 240 hour(s))  MRSA PCR SCREENING     Status: Normal   Collection Time   07/15/12  5:39 AM      Component Value Range Status Comment   MRSA by PCR NEGATIVE  NEGATIVE Final   CLOSTRIDIUM DIFFICILE BY PCR     Status: Normal   Collection Time   07/15/12  9:29 PM      Component Value Range Status Comment   C difficile by pcr NEGATIVE  NEGATIVE Final      Studies:  Recent x-ray studies have been reviewed in detail by the Attending Physician  Scheduled Meds:  Reviewed in detail by the Attending Physician   [facility] Pager: [phone] [facility]

## 2013-06-15 ENCOUNTER — Encounter (HOSPITAL_COMMUNITY): Payer: Self-pay | Admitting: Emergency Medicine

## 2013-06-15 ENCOUNTER — Emergency Department (HOSPITAL_COMMUNITY): Payer: No Typology Code available for payment source

## 2013-06-15 ENCOUNTER — Emergency Department (HOSPITAL_COMMUNITY)
Admission: EM | Admit: 2013-06-15 | Discharge: 2013-06-16 | Disposition: A | Discharge status: Home / Self Care | Payer: No Typology Code available for payment source | Attending: Emergency Medicine | Admitting: Emergency Medicine

## 2013-06-15 DIAGNOSIS — I4891 Unspecified atrial fibrillation: Secondary | ICD-10-CM | POA: Insufficient documentation

## 2013-06-15 DIAGNOSIS — G309 Alzheimer's disease, unspecified: Secondary | ICD-10-CM | POA: Insufficient documentation

## 2013-06-15 DIAGNOSIS — Y9210 Unspecified residential institution as the place of occurrence of the external cause: Secondary | ICD-10-CM | POA: Insufficient documentation

## 2013-06-15 DIAGNOSIS — S0990XA Unspecified injury of head, initial encounter: Secondary | ICD-10-CM

## 2013-06-15 DIAGNOSIS — W19XXXA Unspecified fall, initial encounter: Secondary | ICD-10-CM

## 2013-06-15 DIAGNOSIS — E041 Nontoxic single thyroid nodule: Secondary | ICD-10-CM | POA: Insufficient documentation

## 2013-06-15 DIAGNOSIS — Z7982 Long term (current) use of aspirin: Secondary | ICD-10-CM | POA: Insufficient documentation

## 2013-06-15 DIAGNOSIS — W010XXA Fall on same level from slipping, tripping and stumbling without subsequent striking against object, initial encounter: Secondary | ICD-10-CM | POA: Insufficient documentation

## 2013-06-15 DIAGNOSIS — F0280 Dementia in other diseases classified elsewhere without behavioral disturbance: Secondary | ICD-10-CM | POA: Insufficient documentation

## 2013-06-15 DIAGNOSIS — S0180XA Unspecified open wound of other part of head, initial encounter: Secondary | ICD-10-CM | POA: Insufficient documentation

## 2013-06-15 DIAGNOSIS — Y9389 Activity, other specified: Secondary | ICD-10-CM | POA: Insufficient documentation

## 2013-06-15 DIAGNOSIS — I2510 Atherosclerotic heart disease of native coronary artery without angina pectoris: Secondary | ICD-10-CM | POA: Insufficient documentation

## 2013-06-15 DIAGNOSIS — IMO0002 Reserved for concepts with insufficient information to code with codable children: Secondary | ICD-10-CM | POA: Insufficient documentation

## 2013-06-15 LAB — COMPREHENSIVE METABOLIC PANEL
ALT: 13 U/L (ref 0–53)
AST: 18 U/L (ref 0–37)
Alkaline Phosphatase: 104 U/L (ref 39–117)
CO2: 27 mEq/L (ref 19–32)
GFR calc Af Amer: 47 mL/min — ABNORMAL LOW (ref >90)
Glucose, Bld: 113 mg/dL — ABNORMAL HIGH (ref 70–99)
Potassium: 3.9 mEq/L (ref 3.5–5.1)
Sodium: 138 mEq/L (ref 135–145)
Total Protein: 6.6 g/dL (ref 6.0–8.3)

## 2013-06-15 LAB — CBC WITH DIFFERENTIAL/PLATELET
Basophils Absolute: 0 10*3/uL (ref 0.0–0.1)
Eosinophils Absolute: 0.2 10*3/uL (ref 0.0–0.7)
Lymphocytes Relative: 19 % (ref 12–46)
Lymphs Abs: 1.4 10*3/uL (ref 0.7–4.0)
Neutrophils Relative %: 70 % (ref 43–77)
Platelets: 218 10*3/uL (ref 150–400)
RBC: 4.29 MIL/uL (ref 4.22–5.81)
RDW: 13.9 % (ref 11.5–15.5)
WBC: 7.2 10*3/uL (ref 4.0–10.5)

## 2013-06-15 NOTE — ED Provider Notes (Signed)
CSN: [id number]     Arrival date & time 06/15/13  1936 History   First MD Initiated Contact with Patient 06/15/13 1943     Chief Complaint  Patient presents with  . Fall   (Consider location/radiation/quality/duration/timing/severity/associated sxs/prior Treatment) HPI Anthony Brewer is a 79 y.o. male who presents to emergency department EMS from an assisted-living facility with complaint of a fall. I did call his assisted-living facility and spoke with the [employer] there who took care of the patient. She stated that patient came up to him this evening and told her that he fell. Fall wasn't witnessed. Patient told her that he tripped at that time. Patient had some blood on his hand and his head and EMS was called for transport. Patient normally uses walker however she states he did not use his walker this evening for ambulation. Patient at baseline confused due to his dementia. Patient has not had any falls recently. Patient denies any complaints to me. He also however denies falling and states he does not know why he is here.  Past Medical History  Diagnosis Date  . Dementia   . Alzheimer disease   . Atrial fibrillation   . Coronary artery disease    History reviewed. No pertinent past surgical history. History reviewed. No pertinent family history. History  Substance Use Topics  . Smoking status: Never Smoker   . Smokeless tobacco: Not on file  . Alcohol Use: No    Review of Systems  Unable to perform ROS: Dementia  Allergic/Immunologic: Negative for immunocompromised state.    Allergies  Review of patient's allergies indicates no known allergies.  Home Medications   Current Outpatient Rx  Name  Route  Sig  Dispense  Refill  . aspirin EC 325 MG tablet   Oral   Take 325 mg by mouth daily.         . fludrocortisone (FLORINEF) 0.1 MG tablet   Oral   Take 0.05 mg by mouth daily.         . furosemide (LASIX) 20 MG tablet   Oral   Take 20 mg by mouth every morning.         . galantamine (RAZADYNE ER) 24 MG 24 hr capsule   Oral   Take 24 mg by mouth daily.         . hydrocortisone (CORTEF) 20 MG tablet   Oral   Take 20 mg by mouth daily.         . Memantine HCl ER (NAMENDA XR) 28 MG CP24   Oral   Take 1 capsule by mouth daily.         . Multiple Vitamin (MULTIVITAMIN WITH MINERALS) TABS   Oral   Take 1 tablet by mouth daily.         . nitroGLYCERIN (NITROSTAT) 0.4 MG SL tablet   Sublingual   Place 0.4 mg under the tongue every 5 (five) minutes as needed. For chest pain - do not exceed 3 tabs - if no relief call md/911         . oxybutynin (DITROPAN) 5 MG tablet   Oral   Take 5 mg by mouth 2 (two) times daily.         . polyethylene glycol (MIRALAX / GLYCOLAX) packet   Oral   Take 17 g by mouth daily.         . simethicone (MYLICON) 125 MG chewable tablet   Oral   Chew 125 mg by mouth 2 (two)  times daily.         . sotalol (BETAPACE) 80 MG tablet   Oral   Take 40 mg by mouth daily.         . traMADol (ULTRAM) 50 MG tablet   Oral   Take by mouth 3 (three) times daily as needed for moderate pain.         . verapamil (CALAN) 80 MG tablet   Oral   Take 1 tablet (80 mg total) by mouth every 8 (eight) hours.   30 tablet   0    BP 140/77  Pulse 78  Temp(Src) 98.1 F (36.7 C) (Oral)  Resp 16  SpO2 97% Physical Exam  Nursing note and vitals reviewed. Constitutional: He appears well-developed and well-nourished. No distress.  HENT:  Head: Normocephalic and atraumatic.  Right Ear: External ear normal.  Left Ear: External ear normal.  Nose: Nose normal.  Mouth/Throat: Oropharynx is clear and moist.  A 1 cm non-gaping superficial laceration to the right eyebrow  Eyes: Conjunctivae are normal. Pupils are equal, round, and reactive to light.  Neck: Normal range of motion. Neck supple.  Cardiovascular: Normal rate, regular rhythm, normal heart sounds and intact distal pulses.   No murmur heard. Pulmonary/Chest:  Effort normal. No respiratory distress. He has no wheezes. He has no rales.  Abdominal: Soft. Bowel sounds are normal. He exhibits no distension. There is no tenderness. There is no rebound.  Musculoskeletal: He exhibits no edema.  Full range of motion of bilateral shoulders, elbows, hips, knees, ankles. No midline cervical, thoracic, lumbar spine tenderness.  Neurological: He is alert.  Alert but disoriented at baseline. Follow simple commands. 5/ 5 and equal upper and lower extremity strength bilaterally  Skin: Skin is warm and dry.  Skin tear to the left thumb.  Psychiatric: He has a normal mood and affect. His behavior is normal.    ED Course  Procedures (including critical care time) Labs Review Labs Reviewed  CBC WITH DIFFERENTIAL - Abnormal; Notable for the following:    Hemoglobin 12.9 (*)    HCT 38.8 (*)    All other components within normal limits  COMPREHENSIVE METABOLIC PANEL - Abnormal; Notable for the following:    Glucose, Bld 113 (*)    BUN 26 (*)    Creatinine, Ser 1.48 (*)    GFR calc non Af Amer 40 (*)    GFR calc Af Amer 47 (*)    All other components within normal limits  POCT I-STAT TROPONIN I   Imaging Review Ct Head Wo Contrast  06/15/2013   CLINICAL DATA:  Fall, neck pain, headache, altered mental status  EXAM: CT HEAD WITHOUT CONTRAST  CT CERVICAL SPINE WITHOUT CONTRAST  TECHNIQUE: Multidetector CT imaging of the head and cervical spine was performed following the standard protocol without intravenous contrast. Multiplanar CT image reconstructions of the cervical spine were also generated.  COMPARISON:  08/29/2010  FINDINGS: CT HEAD FINDINGS  Severe diffuse atrophy. No abnormal attenuation to suggest hemorrhage, infarct, or extra-axial fluid. No skull fracture.  CT CERVICAL SPINE FINDINGS  3 cm left thyroid nodule.  IMPRESSION: No acute intracranial abnormalities. No acute traumatic injury to the cervical spine. Left thyroid nodule. Consider nonemergent  ultrasound evaluation.   Electronically Signed   By: Esperanza Heir M.D.   On: 06/15/2013 21:01   Ct Cervical Spine Wo Contrast  06/15/2013   CLINICAL DATA:  Fall, neck pain, headache, altered mental status  EXAM: CT HEAD WITHOUT CONTRAST  CT CERVICAL SPINE WITHOUT CONTRAST  TECHNIQUE: Multidetector CT imaging of the head and cervical spine was performed following the standard protocol without intravenous contrast. Multiplanar CT image reconstructions of the cervical spine were also generated.  COMPARISON:  08/29/2010  FINDINGS: CT HEAD FINDINGS  Severe diffuse atrophy. No abnormal attenuation to suggest hemorrhage, infarct, or extra-axial fluid. No skull fracture.  CT CERVICAL SPINE FINDINGS  3 cm left thyroid nodule.  IMPRESSION: No acute intracranial abnormalities. No acute traumatic injury to the cervical spine. Left thyroid nodule. Consider nonemergent ultrasound evaluation.   Electronically Signed   By: Esperanza Heir M.D.   On: 06/15/2013 21:01    EKG Interpretation     Ventricular Rate:  58 PR Interval:  160 QRS Duration: 95 QT Interval:  456 QTC Calculation: 448 R Axis:   -16 Text Interpretation:  Sinus rhythm Atrial premature complex Borderline left axis deviation No significant change since last tracing            MDM   1. Fall, initial encounter   2. Minor head injury, initial encounter   3. Thyroid nodule     Patient seen and examined. He is in no distress. All lacerations abrasions are superficial and did not need any repair. He is at his baseline. He is confused. Will get CT head and cervical spine, to lab work, EKG to rule out any cardiac cause for her his fall. I do suspect this is just a mechanical fall since patient appears to be well at present and he was not using his walker when he fell.  Patient CT of the head and cervical spine are negative, it was noted that he did have a left thyroid nodule will need outpatient followup. Patient's creatinine slightly  elevated from his baseline is well, we'll need close monitoring. Otherwise everything appears to be normal. Patient has been monitored in emergency department he is in no distress and nontoxic appearing. He will be discharged home.   Filed Vitals:   06/15/13 1941 06/15/13 2232  BP: 140/77 154/89  Pulse: 78 62  Temp: 98.1 F (36.7 C) 98.4 F (36.9 C)  TempSrc: Oral Oral  Resp: 16 16  SpO2: 97% 99%      Jacquelyne Quarry A Seeley Hissong, PA-C 06/15/13 2348

## 2013-06-15 NOTE — ED Notes (Signed)
Bed: WA20 Expected date:  Expected time:  Means of arrival:  Comments: EMS 79yo M, fall, skin tear

## 2013-06-15 NOTE — ED Notes (Addendum)
Per EMS, patient tripped and fell at [facility].  Pt c/o skin tear on face and hand. Pt has hx of dementia. Pt denies pain. A&O compared to baseline according to [facility].

## 2013-06-15 NOTE — ED Notes (Signed)
[facility] called for transport.  

## 2013-06-16 NOTE — ED Provider Notes (Signed)
Medical screening examination/treatment/procedure(s) were conducted as a shared visit with non-physician practitioner(s) and myself.  I personally evaluated the patient during the encounter.  EKG Interpretation     Ventricular Rate:  58 PR Interval:  160 QRS Duration: 95 QT Interval:  456 QTC Calculation: 448 R Axis:   -16 Text Interpretation:  Sinus rhythm Atrial premature complex Borderline left axis deviation No significant change since last tracing            No acute fractures or bleeding. Demented but at his baseline, will transfer back to nursing home.  Audree Camel, MD 06/16/13 [phone]

## 2013-06-16 NOTE — ED Notes (Signed)
Facility made aware that patient will be returning.

## 2014-12-07 DEATH — deceased

## 2015-03-25 IMAGING — CT CT CERVICAL SPINE W/O CM
2 of 4 series · 5 of 14 positions shown, 6 images · non-contrast
Comparison: 08/29/2010

CLINICAL DATA: Fall, neck pain, headache, altered mental status

EXAM:
CT HEAD WITHOUT CONTRAST
CT CERVICAL SPINE WITHOUT CONTRAST
TECHNIQUE: Multidetector CT imaging of the head and cervical spine was
performed following the standard protocol without intravenous
contrast. Multiplanar CT image reconstructions of the cervical spine
were also generated.

[Series 3: c-spine st · axial · 0.29mm/px · z∈[+1592,+1648]mm · 2 of 86 slices shown]
[im 29/86  bone]
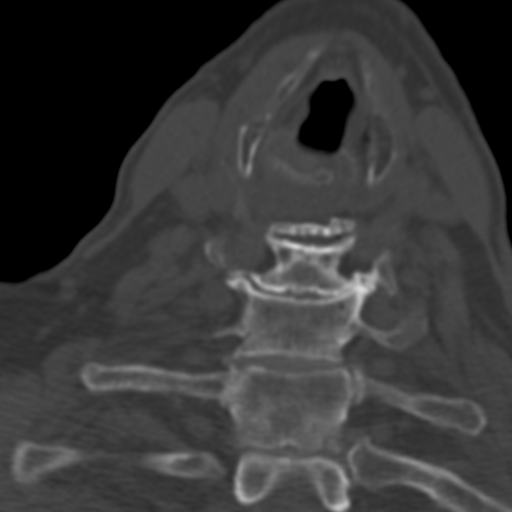
[im 57/86  bone]
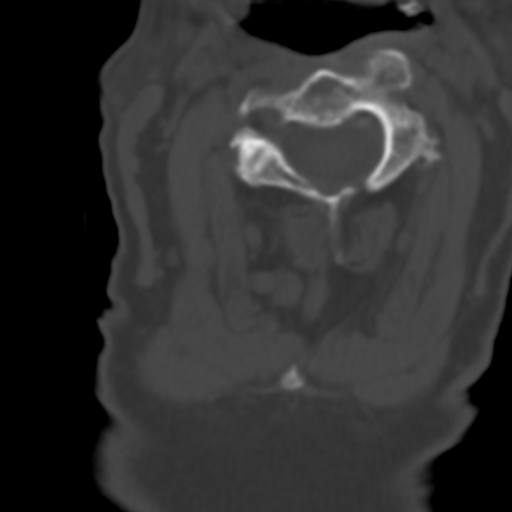

[Series 6: axial reformats · axial · 0.23mm/px · z∈[+1548,+1638]mm · 3 of 96 slices shown, 4 images]
[im 24/96  soft-tissue]
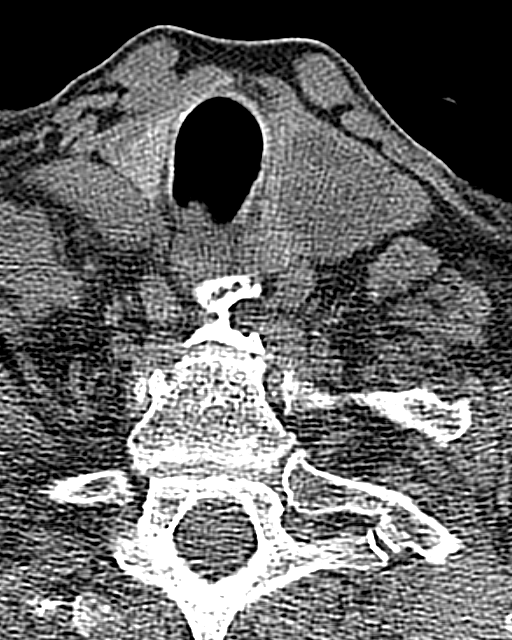
[im 24/96  bone]
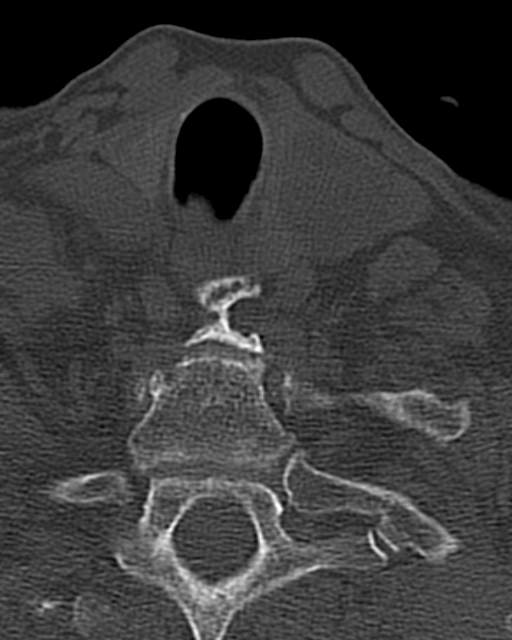
[im 48/96  bone]
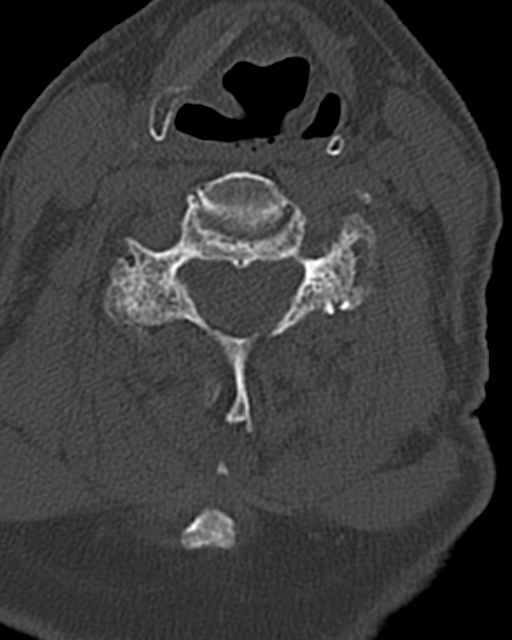
[im 72/96  bone]
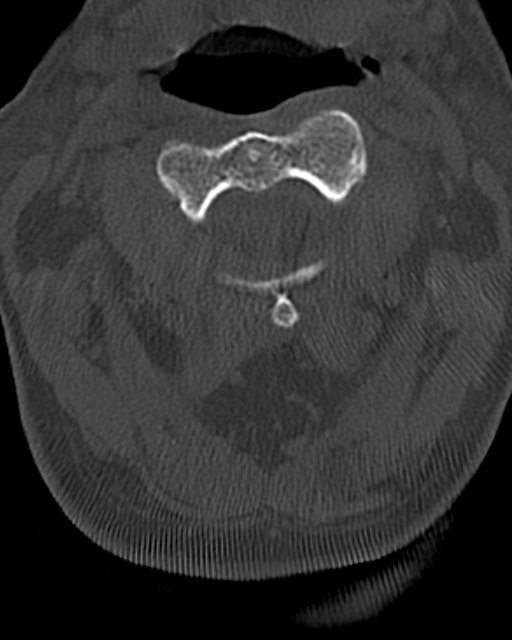

[5 of 14 positions shown; findings below may reference images not displayed]

FINDINGS: CT HEAD FINDINGS

Severe diffuse atrophy. No abnormal attenuation to suggest
hemorrhage, infarct, or extra-axial fluid. No skull fracture.

CT CERVICAL SPINE FINDINGS

3 cm left thyroid nodule.
IMPRESSION: No acute intracranial abnormalities. No acute traumatic injury to
the cervical spine. Left thyroid nodule. Consider nonemergent
ultrasound evaluation.
